# Patient Record
Sex: Male | Born: 1965 | Race: Black or African American | Hispanic: No | Marital: Married | Smoking: Former smoker
Health system: Southern US, Community
[De-identification: ages and names within clinical notes are randomized; demographics above are authoritative.]

## PROBLEM LIST (undated history)

## (undated) DIAGNOSIS — E079 Disorder of thyroid, unspecified: Secondary | ICD-10-CM

## (undated) DIAGNOSIS — I10 Essential (primary) hypertension: Secondary | ICD-10-CM

---

## 2019-12-22 ENCOUNTER — Other Ambulatory Visit: Payer: Self-pay

## 2019-12-22 ENCOUNTER — Emergency Department
Admission: EM | Admit: 2019-12-22 | Discharge: 2019-12-22 | Disposition: A | Discharge status: Home / Self Care | Payer: Self-pay | Attending: Emergency Medicine | Admitting: Emergency Medicine

## 2019-12-22 ENCOUNTER — Encounter: Payer: Self-pay | Admitting: Emergency Medicine

## 2019-12-22 ENCOUNTER — Emergency Department: Payer: Self-pay

## 2019-12-22 DIAGNOSIS — Z9981 Dependence on supplemental oxygen: Secondary | ICD-10-CM | POA: Insufficient documentation

## 2019-12-22 DIAGNOSIS — Y908 Blood alcohol level of 240 mg/100 ml or more: Secondary | ICD-10-CM | POA: Insufficient documentation

## 2019-12-22 DIAGNOSIS — F17210 Nicotine dependence, cigarettes, uncomplicated: Secondary | ICD-10-CM | POA: Insufficient documentation

## 2019-12-22 DIAGNOSIS — F1010 Alcohol abuse, uncomplicated: Secondary | ICD-10-CM | POA: Insufficient documentation

## 2019-12-22 LAB — CBC WITH DIFFERENTIAL/PLATELET
Abs Immature Granulocytes: 0.02 10*3/uL (ref 0.00–0.07)
Basophils Absolute: 0.1 10*3/uL (ref 0.0–0.1)
Basophils Relative: 2 %
Eosinophils Absolute: 0.2 10*3/uL (ref 0.0–0.5)
Eosinophils Relative: 4 %
HCT: 36.9 % — ABNORMAL LOW (ref 39.0–52.0)
Hemoglobin: 12.9 g/dL — ABNORMAL LOW (ref 13.0–17.0)
Immature Granulocytes: 1 %
Lymphocytes Relative: 54 %
Lymphs Abs: 2.4 10*3/uL (ref 0.7–4.0)
MCH: 28.4 pg (ref 26.0–34.0)
MCHC: 35 g/dL (ref 30.0–36.0)
MCV: 81.3 fL (ref 80.0–100.0)
Monocytes Absolute: 0.4 10*3/uL (ref 0.1–1.0)
Monocytes Relative: 10 %
Neutro Abs: 1.3 10*3/uL — ABNORMAL LOW (ref 1.7–7.7)
Neutrophils Relative %: 29 %
Platelets: 217 10*3/uL (ref 150–400)
RBC: 4.54 MIL/uL (ref 4.22–5.81)
RDW: 18 % — ABNORMAL HIGH (ref 11.5–15.5)
WBC: 4.4 10*3/uL (ref 4.0–10.5)
nRBC: 0.5 % — ABNORMAL HIGH (ref 0.0–0.2)

## 2019-12-22 LAB — COMPREHENSIVE METABOLIC PANEL
ALT: 43 U/L (ref 0–44)
AST: 93 U/L — ABNORMAL HIGH (ref 15–41)
Albumin: 3.8 g/dL (ref 3.5–5.0)
Alkaline Phosphatase: 107 U/L (ref 38–126)
Anion gap: 17 — ABNORMAL HIGH (ref 5–15)
BUN: 5 mg/dL — ABNORMAL LOW (ref 6–20)
CO2: 29 mmol/L (ref 22–32)
Calcium: 8.8 mg/dL — ABNORMAL LOW (ref 8.9–10.3)
Chloride: 93 mmol/L — ABNORMAL LOW (ref 98–111)
Creatinine, Ser: 0.59 mg/dL — ABNORMAL LOW (ref 0.61–1.24)
GFR calc Af Amer: >60 mL/min (ref >60)
GFR calc non Af Amer: >60 mL/min (ref >60)
Glucose, Bld: 117 mg/dL — ABNORMAL HIGH (ref 70–99)
Potassium: 3.5 mmol/L (ref 3.5–5.1)
Sodium: 139 mmol/L (ref 135–145)
Total Bilirubin: 1.2 mg/dL (ref 0.3–1.2)
Total Protein: 8.2 g/dL — ABNORMAL HIGH (ref 6.5–8.1)

## 2019-12-22 LAB — URINALYSIS, COMPLETE (UACMP) WITH MICROSCOPIC
Bacteria, UA: NONE SEEN
Bilirubin Urine: NEGATIVE
Glucose, UA: NEGATIVE mg/dL
Hgb urine dipstick: NEGATIVE
Ketones, ur: NEGATIVE mg/dL
Leukocytes,Ua: NEGATIVE
Nitrite: NEGATIVE
Protein, ur: 30 mg/dL — AB
Specific Gravity, Urine: 1.011 (ref 1.005–1.030)
pH: 9 — ABNORMAL HIGH (ref 5.0–8.0)

## 2019-12-22 LAB — AMMONIA: Ammonia: 46 umol/L — ABNORMAL HIGH (ref 9–35)

## 2019-12-22 LAB — URINE DRUG SCREEN, QUALITATIVE (ARMC ONLY)
Amphetamines, Ur Screen: NOT DETECTED
Barbiturates, Ur Screen: NOT DETECTED
Benzodiazepine, Ur Scrn: NOT DETECTED
Cannabinoid 50 Ng, Ur ~~LOC~~: NOT DETECTED
Cocaine Metabolite,Ur ~~LOC~~: NOT DETECTED
MDMA (Ecstasy)Ur Screen: NOT DETECTED
Methadone Scn, Ur: NOT DETECTED
Opiate, Ur Screen: NOT DETECTED
Phencyclidine (PCP) Ur S: NOT DETECTED
Tricyclic, Ur Screen: NOT DETECTED

## 2019-12-22 LAB — ETHANOL: Alcohol, Ethyl (B): 253 mg/dL — ABNORMAL HIGH (ref <10)

## 2019-12-22 LAB — TROPONIN I (HIGH SENSITIVITY): Troponin I (High Sensitivity): 14 ng/L (ref <18)

## 2019-12-22 MED ORDER — SODIUM CHLORIDE 0.9 % IV BOLUS
1000.0000 mL | Freq: Once | INTRAVENOUS | Status: AC
  Administered 2019-12-22: 1000 mL via INTRAVENOUS

## 2019-12-22 MED ORDER — ONDANSETRON HCL 4 MG/2ML IJ SOLN
4.0000 mg | Freq: Once | INTRAMUSCULAR | Status: AC
  Administered 2019-12-22: 4 mg via INTRAVENOUS
  Filled 2019-12-22: qty 2

## 2019-12-22 NOTE — ED Notes (Signed)
Pt reports coughing up phlegm

## 2019-12-22 NOTE — ED Notes (Signed)
Pt able to get in contact with brother Berna Spare on the phone

## 2019-12-22 NOTE — ED Triage Notes (Addendum)
Patient to ER via [facility] from home for c/o AMS x2 days per wife. Patient reports ETOH last night, but denies drug use. Per wife to EMS, there is a question as to whether patient has a shunt. Patient is on 4L O2 at baseline for unknown reason.  Vitals per EMS: 150/80, 97% on 4L, 85 HR (NSR), 159 CBG.

## 2019-12-22 NOTE — ED Notes (Addendum)
Pt is AOX4, ambulatory. Wife in room with pt. Pt getting dressed at this time. WIfe reports pt is still not his normal self- asking for things he is holding.

## 2019-12-22 NOTE — ED Provider Notes (Signed)
[facility] Emergency Department Provider Note  ____________________________________________   First MD Initiated Contact with Patient 12/22/19 [phone]     (approximate)  I have reviewed the triage vital signs and the nursing notes.   HISTORY  Chief Complaint Altered Mental Status    HPI Neziah Braley is a 54 y.o. male who is on 4 L of oxygen at baseline who comes in for altered mental status.  According to patient's wife patient has been more altered for the past 2 days, severe, nothing makes it better, nothing makes it worse.  States that he will be asking for the remote but will be in his hands and just saying things that are not really making sense.  According to EMS wife had mentioned he has been evaluate for possible shunt placement? After discussion with wife it was not about brain shunt but that he has shunting occurring in his lungs causing him to need oxygen.  Upon evaluation of patient he does report drinking alcohol.  He states that it was just too much.  He reports a little bit of chest discomfort denies any abdominal pain.  He is moving all extremities and is able to sit up and reposition himself in bed.  Unable to get full HPI due to patient's altered mental status    History reviewed. No pertinent past medical history.  There are no problems to display for this patient.    Prior to Admission medications   Not on File    Allergies Patient has no allergy information on record.  No family history on file.  Social History Social History   Tobacco Use  . Smoking status: Current Every Day Smoker  . Smokeless tobacco: Never Used  Substance Use Topics  . Alcohol use: Yes  . Drug use: Not on file      Review of Systems Unable to get full review of systems due to patient's altered mental status ____________________________________________   PHYSICAL EXAM:  VITAL SIGNS: ED Triage Vitals [12/22/19 0915]  Enc Vitals Group   BP 117/61     Pulse      Resp 20     Temp 98.5 F (36.9 C)     Temp Source Oral     SpO2      Weight      Height      Head Circumference      Peak Flow      Pain Score 0     Pain Loc      Pain Edu?      Excl. in [place]?     Constitutional: Alert but appears confused, moving all around the bed Eyes: Conjunctivae are red bilaterally.  Constricted pupil possibly at baseline per patient. Head: Atraumatic. Nose: No congestion/rhinnorhea. Mouth/Throat: Mucous membranes are moist.   Neck: No stridor. Trachea Midline. FROM Cardiovascular: Normal rate, regular rhythm. Grossly normal heart sounds.  Good peripheral circulation. Respiratory: Normal respiratory effort.  No retractions. Lungs CTAB. Gastrointestinal: Soft and nontender. No distention. No abdominal bruits.  Musculoskeletal: No lower extremity tenderness nor edema.  No joint effusions. Neurologic: Moving all extremities well.  Positive confusion.  Possible substance on board Skin:  Skin is warm, dry and intact. No rash noted. Psychiatric: Unable to fully assess due to altered mental status GU: Deferred   ____________________________________________   LABS (all labs ordered are listed, but only abnormal results are displayed)  Labs Reviewed  CBC WITH DIFFERENTIAL/PLATELET - Abnormal; Notable for the following components:  Result Value   Hemoglobin 12.9 (*)    HCT 36.9 (*)    RDW 18.0 (*)    nRBC 0.5 (*)    Neutro Abs 1.3 (*)    All other components within normal limits  ETHANOL - Abnormal; Notable for the following components:   Alcohol, Ethyl (B) 253 (*)    All other components within normal limits  URINALYSIS, COMPLETE (UACMP) WITH MICROSCOPIC - Abnormal; Notable for the following components:   Color, Urine YELLOW (*)    APPearance CLEAR (*)    pH 9.0 (*)    Protein, ur 30 (*)    All other components within normal limits  AMMONIA - Abnormal; Notable for the following components:   Ammonia 46 (*)    All other  components within normal limits  COMPREHENSIVE METABOLIC PANEL - Abnormal; Notable for the following components:   Chloride 93 (*)    Glucose, Bld 117 (*)    BUN 5 (*)    Creatinine, Ser 0.59 (*)    Calcium 8.8 (*)    Total Protein 8.2 (*)    AST 93 (*)    Anion gap 17 (*)    All other components within normal limits  URINE DRUG SCREEN, QUALITATIVE ([facility] ONLY)  TROPONIN I (HIGH SENSITIVITY)  TROPONIN I (HIGH SENSITIVITY)   ____________________________________________   ED ECG REPORT I, Concha Se, the attending physician, personally viewed and interpreted this ECG.  EKG is sinus rate of 81, ST elevation in V4 5, no ST depression, no T wave inversion, normal intervals ____________________________________________  RADIOLOGY Vela Prose, personally viewed and evaluated these images (plain radiographs) as part of my medical decision making, as well as reviewing the written report by the radiologist.  ED MD interpretation: No intracranial hemorrhage  Official radiology report(s): CT Head Wo Contrast  Result Date: 12/22/2019 CLINICAL DATA:  Altered mental status. EXAM: CT HEAD WITHOUT CONTRAST TECHNIQUE: Contiguous axial images were obtained from the base of the skull through the vertex without intravenous contrast. COMPARISON:  None. FINDINGS: Brain: No evidence of acute infarction, hemorrhage, hydrocephalus, extra-axial collection or mass lesion/mass effect. Vascular: No hyperdense vessel or unexpected calcification. Skull: Normal. Negative for fracture or focal lesion. Sinuses/Orbits: Globes and orbits are unremarkable. Moderate left and mild right maxillary sinus mucosal thickening. Several ethmoid air cells are opacified with mucosal thickening. Mild to moderate left sphenoid sinus mucosal thickening. Mild left frontal sinus mucosal thickening. Clear mastoid air cells. Other: None. IMPRESSION: 1. No acute intracranial abnormalities. 2. Significant sinus mucosal thickening as  detailed. Electronically Signed   By: Amie Portland M.D.   On: 12/22/2019 10:09    ____________________________________________   PROCEDURES  Procedure(s) performed (including Critical Care):  Procedures   ____________________________________________   INITIAL IMPRESSION / ASSESSMENT AND PLAN / ED COURSE  Elmor Mader was evaluated in Emergency Department on 12/22/2019 for the symptoms described in the history of present illness. He was evaluated in the context of the global COVID-19 pandemic, which necessitated consideration that the patient might be at risk for infection with the SARS-CoV-2 virus that causes COVID-19. Institutional protocols and algorithms that pertain to the evaluation of patients at risk for COVID-19 are in a state of rapid change based on information released by regulatory bodies including the CDC and federal and state organizations. These policies and algorithms were followed during the patient's care in the ED.    Patient is a 54 year old who comes in with altered mental status.  I suspect is most  likely related to alcohol or drug use given patient's presentation.  However given the asymmetric pupils will get CT head and CT cervical evaluate for intracranial hemorrhage, cervical fracture.  Will get labs to evaluate for liver failure, hepatic encephalopathy, UTI.  Will continue to closely monitor patient.  UA no signs of UTI.  Labs are reassuring save for slightly low chloride slightly elevated anion gap but glucose was 117.  Ammonia was slightly elevated but there was hemolysis patient does not have any signs of severe liver disease so I doubt that this is causing altered mental status and him.  EtOH was 253 which I think could be the cause of patient's altered mental status.  CT head was negative for intracranial hemorrhage.  They are able to get a CT cervical given patient was moving around but there was no signs of trauma so they can hold off at this time  2:53 PM  reevaluated patient.  Patient is acting at his baseline self.  He does not have any C-spine tenderness to suggest cervical injury.  Wife is at bedside who states that he is he is now acting normally.  She feels comfortable discharging him home.  She was not aware that he had been drinking prior to her seeing him.  Patient denies any shortness of breath, abdominal pain.   They can follow-up with her primary care doctor for LFTs recheck and recommended to cut down on drinking  I discussed the provisional nature of ED diagnosis, the treatment so far, the ongoing plan of care, follow up appointments and return precautions with the patient and any family or support people present. They expressed understanding and agreed with the plan, discharged home.         ____________________________________________   FINAL CLINICAL IMPRESSION(S) / ED DIAGNOSES   Final diagnoses:  Alcohol abuse      MEDICATIONS GIVEN DURING THIS VISIT:  Medications  ondansetron (ZOFRAN) injection 4 mg (4 mg Intravenous Given 12/22/19 1006)  sodium chloride 0.9 % bolus 1,000 mL (0 mLs Intravenous Stopped 12/22/19 1216)     ED Discharge Orders    None       Note:  This document was prepared using Dragon voice recognition software and may include unintentional dictation errors.   Concha Se, MD 12/23/19 [phone]

## 2019-12-22 NOTE — ED Notes (Signed)
Pt is stating that his wife thinks that he is going crazy and he said he doesn't know what is going on and that he just cannot stay focused, cannot sleep at night and thinks that it may be something psychological. Pt states "Something is just not quite right"

## 2019-12-22 NOTE — ED Notes (Signed)
Pt attempted to call wife, but was unable to remember the phone number

## 2019-12-22 NOTE — Discharge Instructions (Signed)
Patient CT head was negative and labs are otherwise reassuring.  There was some signs of may be his alcohol use affecting his liver so he should start to cut down and have his liver function test rechecked with his primary care doctor.  He can return to the ER if he develops worsening symptoms, fevers or any other concerns

## 2019-12-22 NOTE — ED Notes (Signed)
Pt talking on personal cell

## 2019-12-22 NOTE — ED Notes (Addendum)
Wife states she's going to pick up her work clothes and will be back to get pt.

## 2021-05-30 IMAGING — CT CT HEAD W/O CM
3 series · 16 of 47 positions shown, 19 images · non-contrast
Comparison: None.

CLINICAL DATA: Altered mental status.

EXAM:
CT HEAD WITHOUT CONTRAST
TECHNIQUE: Contiguous axial images were obtained from the base of the skull
through the vertex without intravenous contrast.

[Series 2: head wo · axial · 0.44mm/px · z∈[-109,+31]mm · 10 of 34 slices shown, 13 images]
[im 3/34  brain]
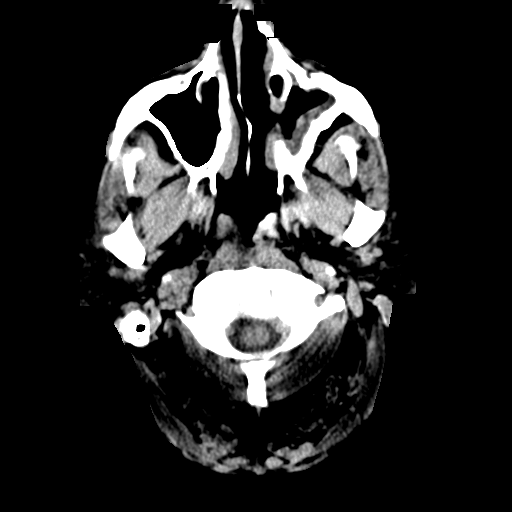
[im 3/34  bone]
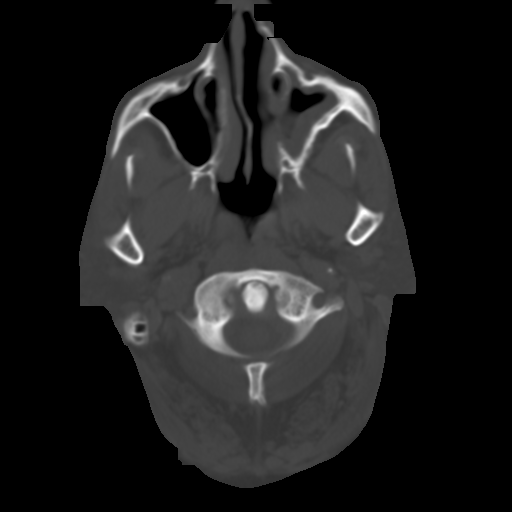
[im 6/34  brain]
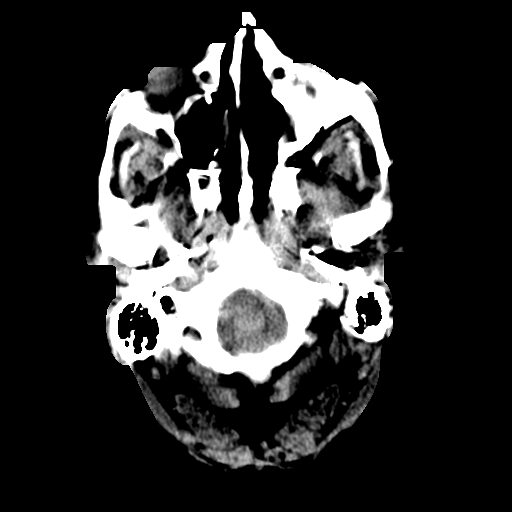
[im 10/34  brain]
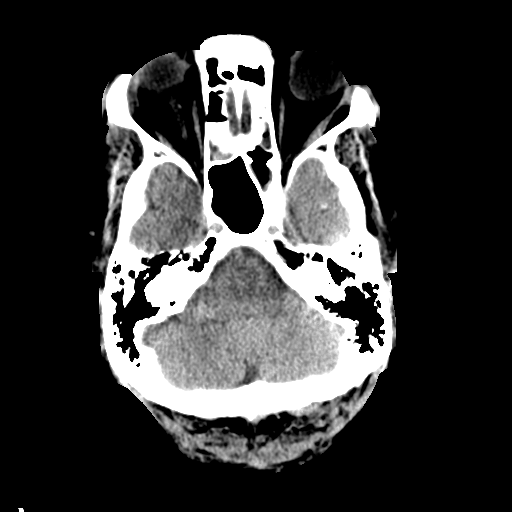
[im 12/34  brain]
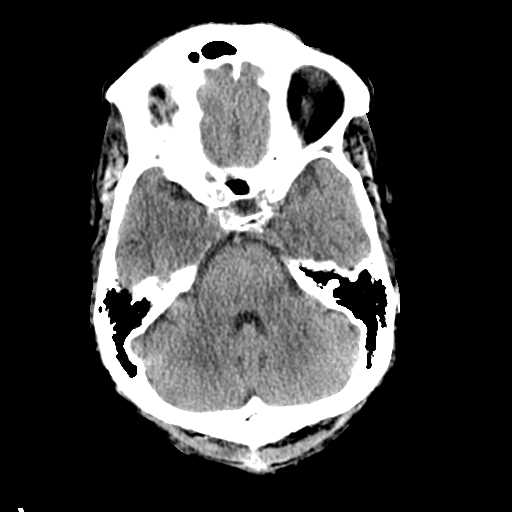
[im 15/34  brain]
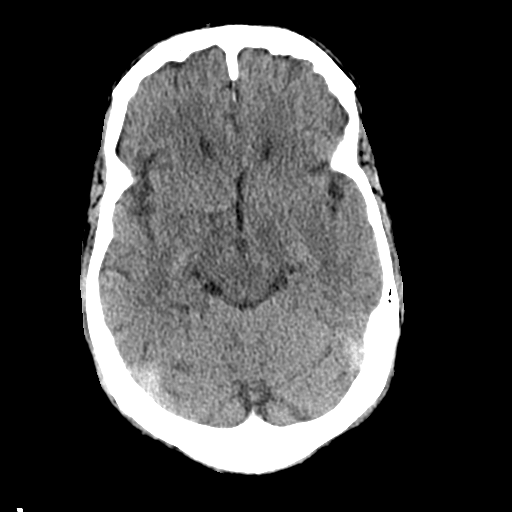
[im 15/34  bone]
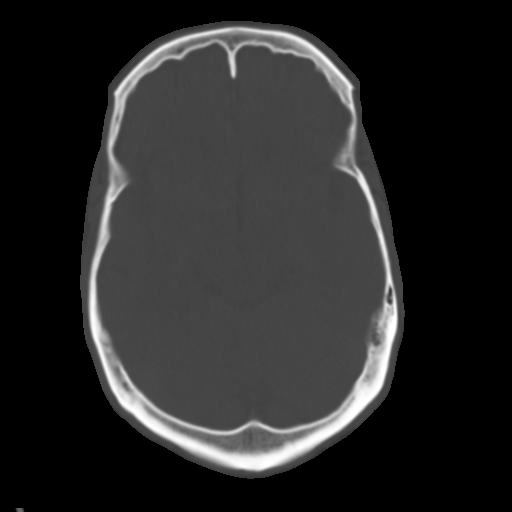
[im 19/34  brain]
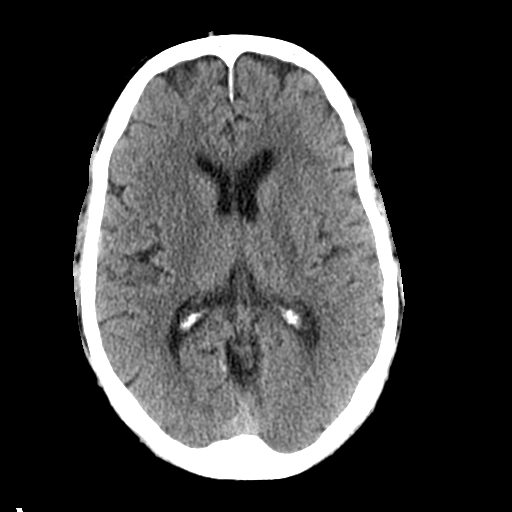
[im 22/34  brain]
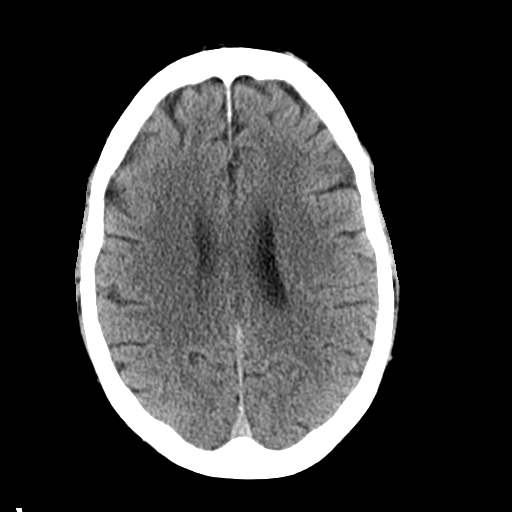
[im 26/34  brain]
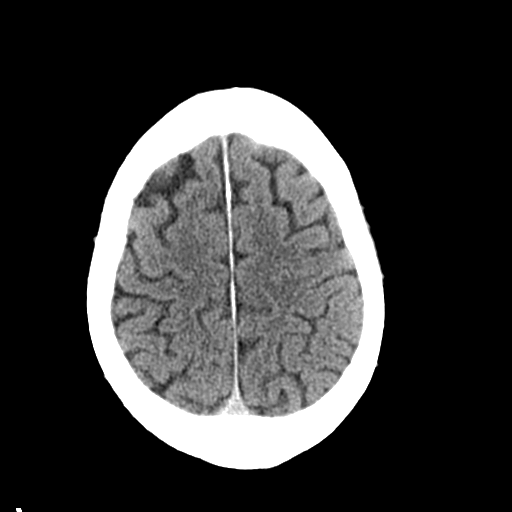
[im 28/34  brain]
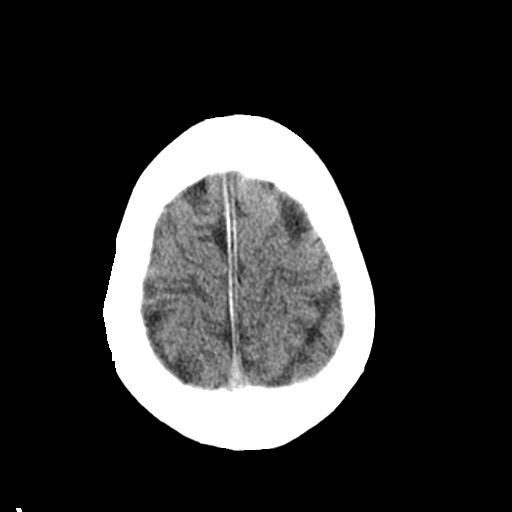
[im 28/34  bone]
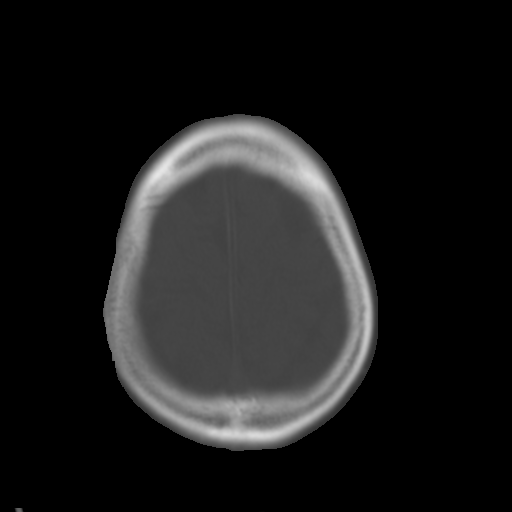
[im 31/34  brain]
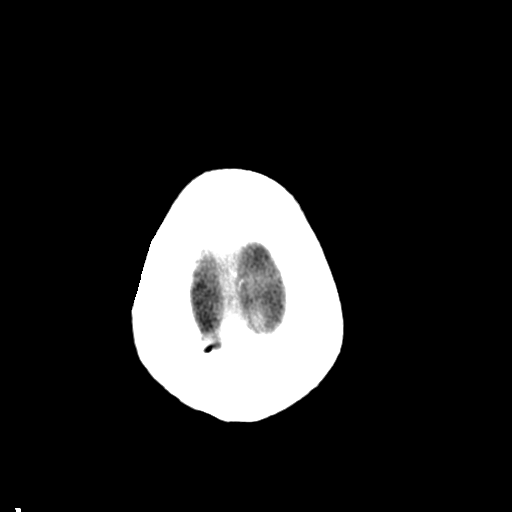

[Series 4: coronal soft tissue · coronal · 0.32mm/px · 3 of 71 slices shown]
[im 24/71  brain]
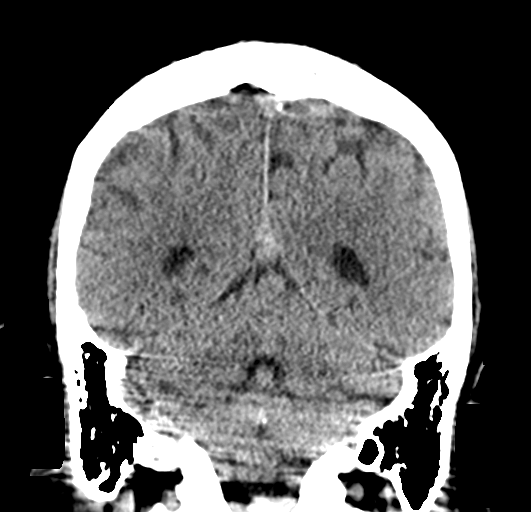
[im 32/71  brain]
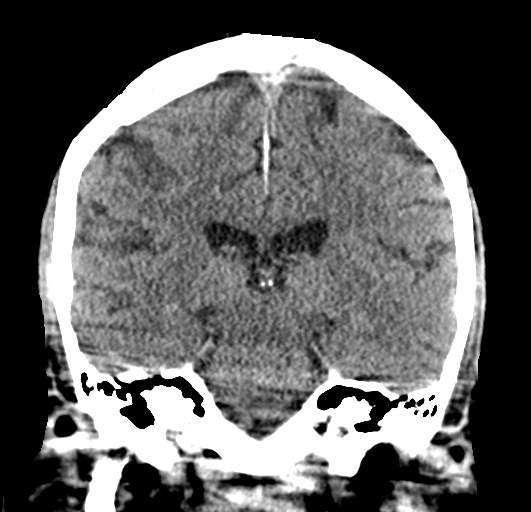
[im 39/71  brain]
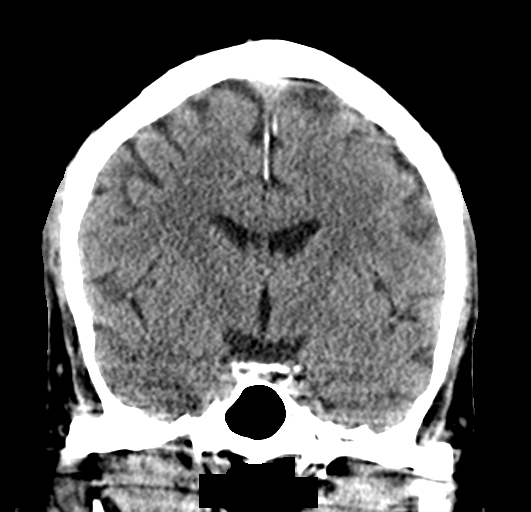

[Series 5: sagittal soft tissue · sagittal · 0.32mm/px · 3 of 58 slices shown]
[im 20/58  brain]
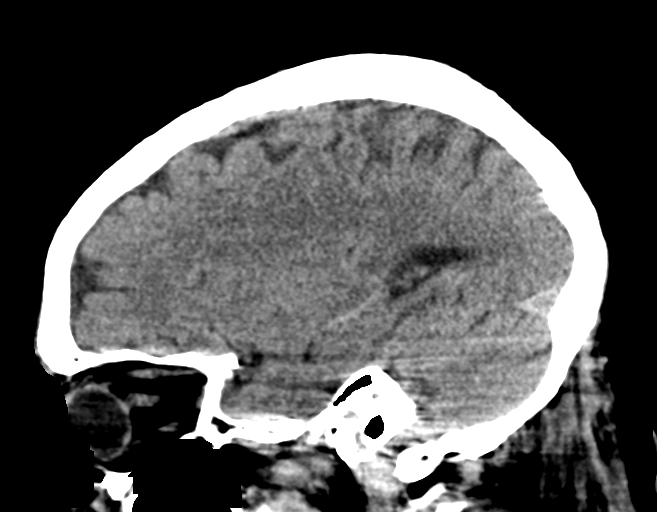
[im 29/58  brain]
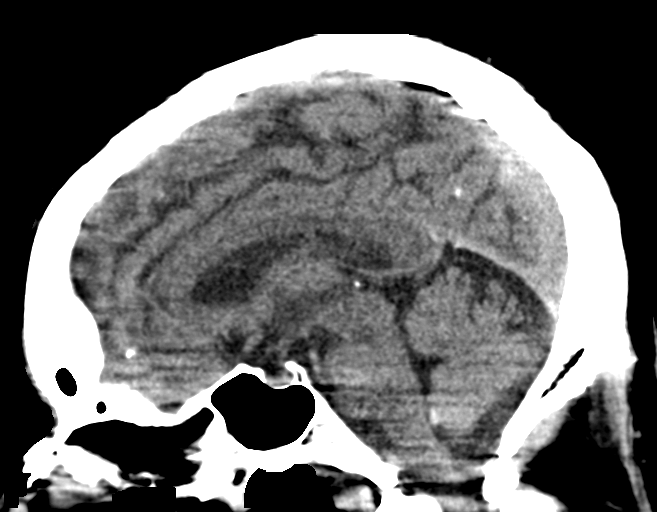
[im 39/58  brain]
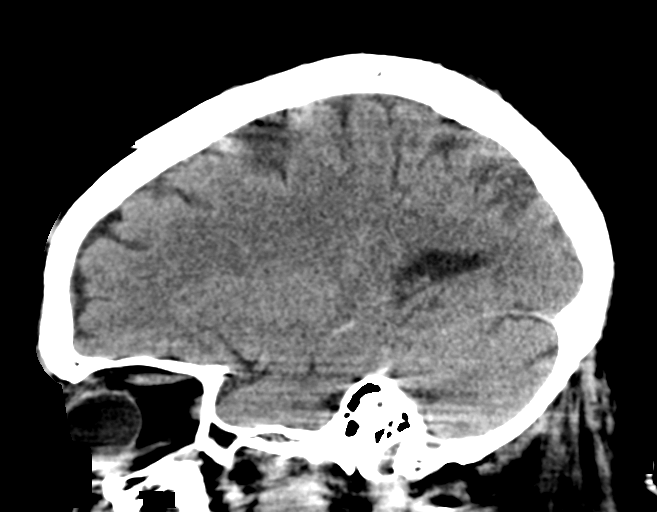

[16 of 47 positions shown; findings below may reference images not displayed]

FINDINGS: Brain: No evidence of acute infarction, hemorrhage, hydrocephalus,
extra-axial collection or mass lesion/mass effect.

Vascular: No hyperdense vessel or unexpected calcification.

Skull: Normal. Negative for fracture or focal lesion.

Sinuses/Orbits: Globes and orbits are unremarkable. Moderate left
and mild right maxillary sinus mucosal thickening. Several ethmoid
air cells are opacified with mucosal thickening. Mild to moderate
left sphenoid sinus mucosal thickening. Mild left frontal sinus
mucosal thickening. Clear mastoid air cells.

Other: None.
IMPRESSION: 1. No acute intracranial abnormalities.
2. Significant sinus mucosal thickening as detailed.

## 2024-02-03 ENCOUNTER — Emergency Department

## 2024-02-03 ENCOUNTER — Encounter: Payer: Self-pay | Admitting: Intensive Care

## 2024-02-03 ENCOUNTER — Other Ambulatory Visit: Payer: Self-pay

## 2024-02-03 ENCOUNTER — Emergency Department
Admission: EM | Admit: 2024-02-03 | Discharge: 2024-02-03 | Disposition: A | Discharge status: Home / Self Care | Attending: Emergency Medicine | Admitting: Emergency Medicine

## 2024-02-03 DIAGNOSIS — J449 Chronic obstructive pulmonary disease, unspecified: Secondary | ICD-10-CM | POA: Diagnosis not present

## 2024-02-03 DIAGNOSIS — E039 Hypothyroidism, unspecified: Secondary | ICD-10-CM | POA: Insufficient documentation

## 2024-02-03 DIAGNOSIS — I10 Essential (primary) hypertension: Secondary | ICD-10-CM | POA: Insufficient documentation

## 2024-02-03 DIAGNOSIS — R079 Chest pain, unspecified: Secondary | ICD-10-CM | POA: Insufficient documentation

## 2024-02-03 DIAGNOSIS — R42 Dizziness and giddiness: Secondary | ICD-10-CM | POA: Diagnosis not present

## 2024-02-03 DIAGNOSIS — H538 Other visual disturbances: Secondary | ICD-10-CM | POA: Diagnosis not present

## 2024-02-03 DIAGNOSIS — R142 Eructation: Secondary | ICD-10-CM | POA: Insufficient documentation

## 2024-02-03 DIAGNOSIS — Z79899 Other long term (current) drug therapy: Secondary | ICD-10-CM | POA: Diagnosis not present

## 2024-02-03 DIAGNOSIS — Z7982 Long term (current) use of aspirin: Secondary | ICD-10-CM | POA: Insufficient documentation

## 2024-02-03 HISTORY — DX: Disorder of thyroid, unspecified: E07.9

## 2024-02-03 HISTORY — DX: Essential (primary) hypertension: I10

## 2024-02-03 LAB — CBC
HCT: 39 % (ref 39.0–52.0)
Hemoglobin: 12.7 g/dL — ABNORMAL LOW (ref 13.0–17.0)
MCH: 24.8 pg — ABNORMAL LOW (ref 26.0–34.0)
MCHC: 32.6 g/dL (ref 30.0–36.0)
MCV: 76 fL — ABNORMAL LOW (ref 80.0–100.0)
Platelets: 158 10*3/uL (ref 150–400)
RBC: 5.13 MIL/uL (ref 4.22–5.81)
RDW: 15.4 % (ref 11.5–15.5)
WBC: 6.4 10*3/uL (ref 4.0–10.5)
nRBC: 0 % (ref 0.0–0.2)

## 2024-02-03 LAB — LIPASE, BLOOD: Lipase: 26 U/L (ref 11–51)

## 2024-02-03 LAB — HEPATIC FUNCTION PANEL
ALT: 35 U/L (ref 0–44)
AST: 45 U/L — ABNORMAL HIGH (ref 15–41)
Albumin: 4 g/dL (ref 3.5–5.0)
Alkaline Phosphatase: 66 U/L (ref 38–126)
Bilirubin, Direct: <0.1 mg/dL (ref 0.0–0.2)
Total Bilirubin: 1.1 mg/dL (ref 0.0–1.2)
Total Protein: 8.4 g/dL — ABNORMAL HIGH (ref 6.5–8.1)

## 2024-02-03 LAB — TROPONIN I (HIGH SENSITIVITY)
Troponin I (High Sensitivity): 12 ng/L (ref <18)
Troponin I (High Sensitivity): 13 ng/L (ref <18)

## 2024-02-03 LAB — BASIC METABOLIC PANEL WITH GFR
Anion gap: 17 — ABNORMAL HIGH (ref 5–15)
BUN: 15 mg/dL (ref 6–20)
CO2: 24 mmol/L (ref 22–32)
Calcium: 8.3 mg/dL — ABNORMAL LOW (ref 8.9–10.3)
Chloride: 97 mmol/L — ABNORMAL LOW (ref 98–111)
Creatinine, Ser: 0.86 mg/dL (ref 0.61–1.24)
GFR, Estimated: >60 mL/min (ref >60)
Glucose, Bld: 120 mg/dL — ABNORMAL HIGH (ref 70–99)
Potassium: 3.1 mmol/L — ABNORMAL LOW (ref 3.5–5.1)
Sodium: 138 mmol/L (ref 135–145)

## 2024-02-03 LAB — PROTIME-INR
INR: 1 (ref 0.8–1.2)
Prothrombin Time: 13.5 s (ref 11.4–15.2)

## 2024-02-03 MED ORDER — IOHEXOL 350 MG/ML SOLN
75.0000 mL | Freq: Once | INTRAVENOUS | Status: AC | PRN
  Administered 2024-02-03: 75 mL via INTRAVENOUS

## 2024-02-03 MED ORDER — ALBUTEROL SULFATE HFA 108 (90 BASE) MCG/ACT IN AERS: 2.0000 | INHALATION_SPRAY | Freq: Four times a day (QID) | RESPIRATORY_TRACT | 0 refills | Status: AC | PRN

## 2024-02-03 MED ORDER — PREDNISONE 20 MG PO TABS
60.0000 mg | ORAL_TABLET | Freq: Every day | ORAL | 0 refills | Status: AC
Start: 2024-02-03 — End: 2024-02-08

## 2024-02-03 MED ORDER — LIDOCAINE VISCOUS HCL 2 % MT SOLN
15.0000 mL | Freq: Once | OROMUCOSAL | Status: AC
  Administered 2024-02-03: 15 mL via ORAL
  Filled 2024-02-03: qty 15

## 2024-02-03 MED ORDER — ALUM & MAG HYDROXIDE-SIMETH 200-200-20 MG/5ML PO SUSP
30.0000 mL | Freq: Once | ORAL | Status: AC
  Administered 2024-02-03: 30 mL via ORAL
  Filled 2024-02-03: qty 30

## 2024-02-03 MED ORDER — HYDROXYZINE HCL 25 MG PO TABS: 25.0000 mg | ORAL_TABLET | Freq: Three times a day (TID) | ORAL | 0 refills | Status: AC | PRN

## 2024-02-03 MED ORDER — POTASSIUM CHLORIDE CRYS ER 20 MEQ PO TBCR
40.0000 meq | EXTENDED_RELEASE_TABLET | Freq: Once | ORAL | Status: AC
  Administered 2024-02-03: 40 meq via ORAL
  Filled 2024-02-03: qty 2

## 2024-02-03 MED ORDER — SUCRALFATE 1 G PO TABS
1.0000 g | ORAL_TABLET | Freq: Once | ORAL | Status: AC
  Administered 2024-02-03: 1 g via ORAL
  Filled 2024-02-03: qty 1

## 2024-02-03 MED ORDER — HYDROXYZINE HCL 25 MG PO TABS
25.0000 mg | ORAL_TABLET | Freq: Once | ORAL | Status: AC
  Administered 2024-02-03: 25 mg via ORAL
  Filled 2024-02-03: qty 1

## 2024-02-03 MED ORDER — LACTATED RINGERS IV BOLUS
1000.0000 mL | Freq: Once | INTRAVENOUS | Status: AC
  Administered 2024-02-03: 1000 mL via INTRAVENOUS

## 2024-02-03 MED ORDER — IPRATROPIUM-ALBUTEROL 0.5-2.5 (3) MG/3ML IN SOLN
3.0000 mL | Freq: Once | RESPIRATORY_TRACT | Status: AC
  Administered 2024-02-03: 3 mL via RESPIRATORY_TRACT
  Filled 2024-02-03: qty 3

## 2024-02-03 NOTE — Progress Notes (Signed)
   02/03/24 1600  Spiritual Encounters  Type of Visit Initial  Care provided to: Pt and family  Referral source Chaplain assessment  Reason for visit Routine spiritual support  OnCall Visit No  Interventions  Spiritual Care Interventions Made Established relationship of care and support  Intervention Outcomes  Outcomes Connection to spiritual care  Spiritual Care Plan  Spiritual Care Issues Still Outstanding No further spiritual care needs at this time (see row info)   Pt asked for a blanket, socks, and water. Chaplain acquired a blanket, some socks, and water (after talking to nurse first). Pt wanted a TV changer, but there are none available.

## 2024-02-03 NOTE — ED Provider Notes (Signed)
-----------------------------------------   3:06 PM on 02/03/2024 -----------------------------------------  Blood pressure (!) 162/86, pulse 93, temperature 98.4 F (36.9 C), temperature source Oral, resp. rate 20, height 6' 2 (1.88 m), weight 131.5 kg, SpO2 92%.  Assuming care from Dr. Waymond.  In short, Darryl Oconnor is a 58 y.o. male with a chief complaint of Chest Pain .  Refer to the original H&P for additional details.  The current plan of care is to follow-up CT head for blurry vision and lightheadedness.  ----------------------------------------- 7:52 PM on 02/03/2024 ----------------------------------------- CT head is negative for acute process.  On reassessment, patient complains of some difficulty breathing and noted to be mildly hypoxic on room air.  CTA chest was performed and negative for PE or other acute finding.  Patient does report that he has had some chest tightness with the difficulty breathing, reports history of smoking in the past and has been told that he has early COPD.  He was given a DuoNeb and reports feeling better.  He was ambulated on room air with no hypoxia or significant difficulty breathing and is appropriate for outpatient management.  Will prescribe course of steroids and albuterol, he was counseled to return to the ED for new or worsening symptoms but otherwise follow-up with his PCP.  Patient and spouse agree with plan.     Willo Dunnings, MD 02/03/24 [phone]

## 2024-02-03 NOTE — ED Provider Notes (Signed)
 [facility] Provider Note    Event Date/Time   First MD Initiated Contact with Patient 02/03/24 1254     (approximate)   History   Chest Pain   HPI  Darryl Oconnor is a 58 y.o. male who with history of hypertension, hypothyroidism, COPD on 2 L nasal cannula at baseline presenting with left lower chest pain, bloating, burping, left upper quadrant abdominal pain.  Also states that he woke up this morning, felt lightheaded, states that he is only able to see out of 1 eye, states that he noted that his right eye had blurry vision, does not have any pain or headache.  No trauma or falls.  States that the blurry vision is only when he sees far but when he sees near it is clear.  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  Does state that he is quite anxious.  He denies any shortness of breath.  Does have a bit of a cough.  No leg swelling.  On independent history from EMS, he had blood glucose was 120, was given full dose aspirin, 4mg  Zofran , blood pressure was 140/100, heart rate was 100.     Physical Exam   Triage Vital Signs: ED Triage Vitals  Encounter Vitals Group     BP 02/03/24 1040 (!) 141/84     Girls Systolic BP Percentile --      Girls Diastolic BP Percentile --      Boys Systolic BP Percentile --      Boys Diastolic BP Percentile --      Pulse Rate 02/03/24 1040 95     Resp 02/03/24 1040 20     Temp 02/03/24 1040 98.5 F (36.9 C)     Temp Source 02/03/24 1040 Oral     SpO2 02/03/24 1040 94 %     Weight 02/03/24 1043 290 lb (131.5 kg)     Height 02/03/24 1043 6' 2 (1.88 m)     Head Circumference --      Peak Flow --      Pain Score 02/03/24 1043 8     Pain Loc --      Pain Education --      Exclude from Growth Chart --     Most recent vital signs: Vitals:   02/03/24 1444 02/03/24 1455  BP:  (!) 162/86   Pulse:  93  Resp:  20  Temp:  98.4 F (36.9 C)  SpO2: (!) 89% 92%     General: Awake, no distress.  CV:  Good peripheral perfusion.  Resp:  Normal effort.  No thoracic cage tenderness Abd:  No distention.  Soft nontender Other:  Left pupil is mildly reactive, right pupil is teardrop shaped, mildly reactive, extraocular movements are intact, no other cranial nerve deficits, no focal weakness or numbness.  no lower extremity edema.   ED Results / Procedures / Treatments   Labs (all labs ordered are listed, but only abnormal results are displayed) Labs Reviewed  BASIC METABOLIC PANEL WITH GFR - Abnormal; Notable for the following components:      Result Value   Potassium 3.1 (*)    Chloride 97 (*)    Glucose, Bld 120 (*)    Calcium 8.3 (*)    Anion gap 17 (*)    All other  components within normal limits  CBC - Abnormal; Notable for the following components:   Hemoglobin 12.7 (*)    MCV 76.0 (*)    MCH 24.8 (*)    All other components within normal limits  HEPATIC FUNCTION PANEL - Abnormal; Notable for the following components:   Total Protein 8.4 (*)    AST 45 (*)    All other components within normal limits  PROTIME-INR  LIPASE, BLOOD  TROPONIN I (HIGH SENSITIVITY)  TROPONIN I (HIGH SENSITIVITY)     EKG  EKG shows, sinus rhythm, rate 95, normal QRS, normal QTc, T wave flattening in aVL, no ischemic ST elevation,   RADIOLOGY On my independent interpretation, chest x-ray without obvious consolidation   PROCEDURES:  Critical Care performed: No  Procedures   MEDICATIONS ORDERED IN ED: Medications  potassium chloride SA (KLOR-CON M) CR tablet 40 mEq (has no administration in time range)  sucralfate (CARAFATE) tablet 1 g (has no administration in time range)  lactated ringers bolus 1,000 mL (0 mLs Intravenous Stopped 02/03/24 1458)  hydrOXYzine (ATARAX) tablet 25 mg (25 mg Oral Given 02/03/24 1330)  alum & mag hydroxide-simeth (MAALOX/MYLANTA) 200-200-20  MG/5ML suspension 30 mL (30 mLs Oral Given 02/03/24 1330)    And  lidocaine (XYLOCAINE) 2 % viscous mouth solution 15 mL (15 mLs Oral Given 02/03/24 1330)     IMPRESSION / MDM / ASSESSMENT AND PLAN / ED COURSE  I reviewed the triage vital signs and the nursing notes.                              Differential diagnosis includes, but is not limited to, dehydration, electrolyte derangements, anxiety, ACS, peptic ulcer disease, GERD, no focal deficits on exam to suggest CVA, no eye pain or headache to suggest glaucoma, he does not have any blurry vision when seeing near, no diplopia.  Will get a CT head, chest x-ray, labs, EKG, troponin, IV fluids, GI cocktail, hydroxyzine.  Reassess.  Patient's presentation is most consistent with acute presentation with potential threat to life or bodily function.  Independent interpretation of labs and imaging below.  Patient signed out pending CT head, had put in referral for primary care, cardiology as well as number to call for [facility].  If CT is negative and symptoms are improving, able to discharge home with outpatient follow-up.    The patient is on the cardiac monitor to evaluate for evidence of arrhythmia and/or significant heart rate changes.   Clinical Course as of 02/03/24 1512  Fri Feb 03, 2024  1341 DG Chest 2 View IMPRESSION: Linear opacity at the right lung base anteriorly likely scar or atelectasis.   [TT]  1434 Independent review of labs, lipase normal, troponin x 2 is negative, potassium is mildly low, will replete, rest electrolytes are not severely deranged, AST is mildly elevated, rest of LFTs are normal, no leukocytosis. [TT]    Clinical Course User Index [TT] Waymond Lorelle Cummins, MD     FINAL CLINICAL IMPRESSION(S) / ED DIAGNOSES   Final diagnoses:  Chest pain, unspecified type  Burping  Lightheadedness  Blurry vision, right eye     Rx / DC Orders   ED Discharge Orders          Ordered    Ambulatory Referral  to Primary Care (Establish Care)        02/03/24 1500    Ambulatory referral to Cardiology  Comments: If you have not heard from the Cardiology office within the next 72 hours please call [phone].   02/03/24 1501             Note:  This document was prepared using Dragon voice recognition software and may include unintentional dictation errors.    Waymond Lorelle Cummins, MD 02/03/24 [phone]

## 2024-02-03 NOTE — ED Notes (Signed)
 Ambulatory O2 saturation for patient. Seated in stretcher, pulse 102, O2 sat: 96%. Pt ambulated length of B pod and back to room. Pulse elevated to 109, O2 sat dropped to 92%. Pt denied increased respiratory effort or hypoxic distress. Pt returned to stretcher and O2 sat returned to 96%.

## 2024-02-03 NOTE — Discharge Instructions (Signed)
 For blurry vision when looking far away, please follow-up with the [facility] for further management.  I have also put in a referral for cardiology for your chest pain.

## 2024-02-03 NOTE — ED Triage Notes (Signed)
 Arrived by [facility] from home with left sided chest pain, sob, and reflux. Reports he normally has reflux but states this feels different. Also c/o blurry vision and lightheadedness upon arrival.  Patient reports he is anxious  EMS vitals: 146/100 b/p 120CBG 100 HR 96% RA   20 G L forearm  EMS administered:  4mg  zofran  324mg  aspirin

## 2024-02-03 NOTE — ED Notes (Signed)
 Pt appears very anxious and advised his CP has returned.
